# Patient Record
Sex: Male | Born: 1950 | Race: White | Hispanic: No | Marital: Single | Smoking: Former smoker
Health system: Southern US, Community
[De-identification: ages and names within clinical notes are randomized; demographics above are authoritative.]

## PROBLEM LIST (undated history)

## (undated) DIAGNOSIS — Z789 Other specified health status: Secondary | ICD-10-CM

## (undated) HISTORY — PX: SPLENECTOMY: SUR1306

---

## 2015-11-18 ENCOUNTER — Encounter: Payer: Self-pay | Admitting: Internal Medicine

## 2015-11-18 ENCOUNTER — Observation Stay
Admission: EM | Admit: 2015-11-18 | Discharge: 2015-11-19 | Disposition: A | Discharge status: Home / Self Care | Payer: Self-pay | Attending: Internal Medicine | Admitting: Internal Medicine

## 2015-11-18 ENCOUNTER — Emergency Department: Payer: Self-pay

## 2015-11-18 ENCOUNTER — Observation Stay: Payer: Self-pay

## 2015-11-18 DIAGNOSIS — I10 Essential (primary) hypertension: Secondary | ICD-10-CM | POA: Insufficient documentation

## 2015-11-18 DIAGNOSIS — D72829 Elevated white blood cell count, unspecified: Secondary | ICD-10-CM | POA: Insufficient documentation

## 2015-11-18 DIAGNOSIS — I160 Hypertensive urgency: Secondary | ICD-10-CM

## 2015-11-18 DIAGNOSIS — R200 Anesthesia of skin: Secondary | ICD-10-CM | POA: Diagnosis present

## 2015-11-18 DIAGNOSIS — Z579 Occupational exposure to unspecified risk factor: Secondary | ICD-10-CM

## 2015-11-18 DIAGNOSIS — M5134 Other intervertebral disc degeneration, thoracic region: Secondary | ICD-10-CM | POA: Insufficient documentation

## 2015-11-18 DIAGNOSIS — G451 Carotid artery syndrome (hemispheric): Secondary | ICD-10-CM

## 2015-11-18 DIAGNOSIS — I6522 Occlusion and stenosis of left carotid artery: Principal | ICD-10-CM | POA: Insufficient documentation

## 2015-11-18 DIAGNOSIS — D696 Thrombocytopenia, unspecified: Secondary | ICD-10-CM | POA: Insufficient documentation

## 2015-11-18 DIAGNOSIS — Z87891 Personal history of nicotine dependence: Secondary | ICD-10-CM | POA: Insufficient documentation

## 2015-11-18 DIAGNOSIS — E876 Hypokalemia: Secondary | ICD-10-CM | POA: Insufficient documentation

## 2015-11-18 HISTORY — DX: Other specified health status: Z78.9

## 2015-11-18 LAB — DIFFERENTIAL
Basophils Absolute: 0.1 10*3/uL (ref 0–0.1)
Basophils Relative: 1 %
Eosinophils Absolute: 0.1 10*3/uL (ref 0–0.7)
Lymphs Abs: 3.8 10*3/uL — ABNORMAL HIGH (ref 1.0–3.6)
MONO ABS: 1.5 10*3/uL — AB (ref 0.2–1.0)
Neutro Abs: 7.1 10*3/uL — ABNORMAL HIGH (ref 1.4–6.5)
Neutrophils Relative %: 56 %

## 2015-11-18 LAB — PROTIME-INR
INR: 0.84
PROTHROMBIN TIME: 11.5 s (ref 11.4–15.2)

## 2015-11-18 LAB — COMPREHENSIVE METABOLIC PANEL
ALK PHOS: 51 U/L (ref 38–126)
ALT: 19 U/L (ref 17–63)
ANION GAP: 8 (ref 5–15)
AST: 19 U/L (ref 15–41)
Albumin: 4 g/dL (ref 3.5–5.0)
BILIRUBIN TOTAL: 0.9 mg/dL (ref 0.3–1.2)
BUN: 14 mg/dL (ref 6–20)
CALCIUM: 9 mg/dL (ref 8.9–10.3)
CO2: 26 mmol/L (ref 22–32)
Chloride: 105 mmol/L (ref 101–111)
Creatinine, Ser: 0.73 mg/dL (ref 0.61–1.24)
Glucose, Bld: 108 mg/dL — ABNORMAL HIGH (ref 65–99)
Potassium: 3.4 mmol/L — ABNORMAL LOW (ref 3.5–5.1)
SODIUM: 139 mmol/L (ref 135–145)
TOTAL PROTEIN: 7.9 g/dL (ref 6.5–8.1)

## 2015-11-18 LAB — CBC
HCT: 46.2 % (ref 40.0–52.0)
Hemoglobin: 15.9 g/dL (ref 13.0–18.0)
MCH: 32.5 pg (ref 26.0–34.0)
MCHC: 34.4 g/dL (ref 32.0–36.0)
MCV: 94.5 fL (ref 80.0–100.0)
PLATELETS: 230 10*3/uL (ref 150–440)
RBC: 4.89 MIL/uL (ref 4.40–5.90)
RDW: 13.7 % (ref 11.5–14.5)
WBC: 12.6 10*3/uL — ABNORMAL HIGH (ref 3.8–10.6)

## 2015-11-18 LAB — TROPONIN I

## 2015-11-18 LAB — APTT: aPTT: 28 seconds (ref 24–36)

## 2015-11-18 LAB — GLUCOSE, CAPILLARY: GLUCOSE-CAPILLARY: 98 mg/dL (ref 65–99)

## 2015-11-18 MED ORDER — ACETAMINOPHEN 650 MG RE SUPP: 650.0000 mg | Freq: Four times a day (QID) | RECTAL | Status: DC | PRN

## 2015-11-18 MED ORDER — SODIUM CHLORIDE 0.9% FLUSH
3.0000 mL | Freq: Two times a day (BID) | INTRAVENOUS | Status: DC
  Administered 2015-11-18: 3 mL via INTRAVENOUS

## 2015-11-18 MED ORDER — LABETALOL HCL 5 MG/ML IV SOLN
10.0000 mg | INTRAVENOUS | Status: DC | PRN
  Administered 2015-11-18: 10 mg via INTRAVENOUS
  Filled 2015-11-18 (×2): qty 4

## 2015-11-18 MED ORDER — LABETALOL HCL 5 MG/ML IV SOLN
10.0000 mg | Freq: Once | INTRAVENOUS | Status: AC
  Administered 2015-11-18: 10 mg via INTRAVENOUS
  Filled 2015-11-18: qty 4

## 2015-11-18 MED ORDER — HYDRALAZINE HCL 20 MG/ML IJ SOLN: 10.0000 mg | INTRAMUSCULAR | Status: DC | PRN

## 2015-11-18 MED ORDER — ASPIRIN EC 81 MG PO TBEC
81.0000 mg | DELAYED_RELEASE_TABLET | Freq: Every day | ORAL | Status: DC
  Administered 2015-11-19: 81 mg via ORAL
  Filled 2015-11-18: qty 1

## 2015-11-18 MED ORDER — LABETALOL HCL 100 MG PO TABS
100.0000 mg | ORAL_TABLET | ORAL | Status: AC
  Administered 2015-11-18: 100 mg via ORAL
  Filled 2015-11-18: qty 1

## 2015-11-18 MED ORDER — LABETALOL HCL 5 MG/ML IV SOLN: 10.0000 mg | INTRAVENOUS | Status: DC | PRN

## 2015-11-18 MED ORDER — ENOXAPARIN SODIUM 40 MG/0.4ML ~~LOC~~ SOLN
40.0000 mg | SUBCUTANEOUS | Status: DC
  Administered 2015-11-18: 23:00:00 40 mg via SUBCUTANEOUS
  Filled 2015-11-18: qty 0.4

## 2015-11-18 MED ORDER — CARBAMIDE PEROXIDE 6.5 % OT SOLN
5.0000 [drp] | Freq: Two times a day (BID) | OTIC | Status: DC
  Administered 2015-11-18 – 2015-11-19 (×2): 5 [drp] via OTIC
  Filled 2015-11-18: qty 15

## 2015-11-18 MED ORDER — POTASSIUM CHLORIDE CRYS ER 20 MEQ PO TBCR
40.0000 meq | EXTENDED_RELEASE_TABLET | Freq: Once | ORAL | Status: AC
  Administered 2015-11-18: 40 meq via ORAL
  Filled 2015-11-18: qty 2

## 2015-11-18 MED ORDER — ACETAMINOPHEN 325 MG PO TABS: 650.0000 mg | ORAL_TABLET | Freq: Four times a day (QID) | ORAL | Status: DC | PRN

## 2015-11-18 MED ORDER — HYDROCHLOROTHIAZIDE 25 MG PO TABS
25.0000 mg | ORAL_TABLET | Freq: Every day | ORAL | Status: DC
  Administered 2015-11-18 – 2015-11-19 (×2): 25 mg via ORAL
  Filled 2015-11-18 (×2): qty 1

## 2015-11-18 MED ORDER — STROKE: EARLY STAGES OF RECOVERY BOOK
Freq: Once | Status: AC
  Administered 2015-11-18: 22:00:00

## 2015-11-18 NOTE — ED Notes (Signed)
Delivered pt blood spec to lab, handed off to lab tech.

## 2015-11-18 NOTE — ED Provider Notes (Signed)
[facility] Emergency Department Provider Note   ____________________________________________   First MD Initiated Contact with Patient 11/18/15 1857     (approximate)  I have reviewed the triage vital signs and the nursing notes.  EM caveat acute stroke, acuity of symptoms and concern for acute neurologic deficit prompted immediate evaluation for neurologic complaint and extremely focused assessment  HISTORY  Chief Complaint Numbness    HPI Raymond Ramirez is a 65 y.o. male reports no major medical history but has been told his blood pressure seemed high by his doctor in the past but didn't start treatment.  At 3 PM today the patient was getting ready for bed when he noticed it feeling of a tingling and a slight numbing over his left hand and leg. No weakness. No trouble speaking. No headache. No chest pain or shortness of breath.  He denies previous history of stroke.  No nausea or vomiting. He reports that he is told he has high blood pressure but has not been started on a medicine.   No past medical history on file.  Patient Active Problem List   Diagnosis Date Noted  . Numbness on left side 11/18/2015    No past surgical history on file.  Prior to Admission medications   Not on File  Patient took 4 full strength aspirin tablets prior to arrival today  Allergies Review of patient's allergies indicates not on file.  No family history on file.  Social History Social History  Substance Use Topics  . Smoking status: Not on file  . Smokeless tobacco: Not on file  . Alcohol use Not on file    Review of Systems Constitutional: No fever/chills Eyes: No visual changes. ENT: No sore throat. Cardiovascular: Denies chest pain. Respiratory: Denies shortness of breath. Gastrointestinal: No abdominal pain.  No nausea, no vomiting.  No diarrhea.  No constipation. Genitourinary: Negative for dysuria. Musculoskeletal: Negative for back  pain. Skin: Negative for rash. Neurological: Negative for headaches or focal weakness.  10-point ROS otherwise negative.  ____________________________________________   PHYSICAL EXAM:  VITAL SIGNS: ED Triage Vitals [11/18/15 1838]  Enc Vitals Group     BP (!) 192/99     Pulse Rate 82     Resp 20     Temp 98.1 F (36.7 C)     Temp Source Oral     SpO2 98 %     Weight 185 lb (83.9 kg)     Height 5\' 11"  (1.803 m)     Head Circumference      Peak Flow      Pain Score      Pain Loc      Pain Edu?      Excl. in GC?     Constitutional: Alert and oriented. Well appearing and in no acute distress. Eyes: Conjunctivae are normal. PERRL. EOMI. Head: Atraumatic. Nose: No congestion/rhinnorhea. Mouth/Throat: Mucous membranes are moist.  Oropharynx non-erythematous. Neck: No stridor.   Cardiovascular: Normal rate, regular rhythm. Grossly normal heart sounds.  Good peripheral circulation. Respiratory: Normal respiratory effort.  No retractions. Lungs CTAB. Gastrointestinal: Soft and nontender. No distention.  Musculoskeletal: No lower extremity tenderness nor edema.  No joint effusions. Neurologic:  Normal speech and language.   NIH score equals 1, performed by me at bedside. The patient has no pronator drift. The patient has normal cranial nerve exam. Extraocular movements are normal. Visual fields are normal. Patient has 5 out of 5 strength in all extremities. There is no  numbness or gross, acute sensory abnormality in the extremities bilaterally except for some mild decreased sensation noted over the left arm and leg. No speech disturbance. No dysarthria. No aphasia. No ataxia. Normal finger nose finger bilat. Patient speaking in full and clear sentences.   Skin:  Skin is warm, dry and intact. No rash noted. Psychiatric: Mood and affect are normal. Speech and behavior are normal.  ____________________________________________   LABS (all labs ordered are listed, but  only abnormal results are displayed)  Labs Reviewed  CBC - Abnormal; Notable for the following:       Result Value   WBC 12.6 (*)    All other components within normal limits  DIFFERENTIAL - Abnormal; Notable for the following:    Neutro Abs 7.1 (*)    Lymphs Abs 3.8 (*)    Monocytes Absolute 1.5 (*)    All other components within normal limits  COMPREHENSIVE METABOLIC PANEL - Abnormal; Notable for the following:    Potassium 3.4 (*)    Glucose, Bld 108 (*)    All other components within normal limits  PROTIME-INR  APTT  TROPONIN I  GLUCOSE, CAPILLARY  HEMOGLOBIN A1C  LIPID PANEL  BASIC METABOLIC PANEL  CBC  CBC  CREATININE, SERUM  CBG MONITORING, ED   ____________________________________________  EKG  ED ECG REPORT I, QUALE, MARK, the attending physician, personally viewed and interpreted this ECG.  Date: 11/18/2015 EKG Time: 1905 Rate: 75 Rhythm: normal sinus rhythm QRS Axis: normal Intervals: normal ST/T Wave abnormalities: Appear fairly normal, though there is a slight hint of ST depression or abnormality in lateral precordial leads Conduction Disturbances: none Narrative Interpretation: unremarkable except slight hint of ST depression or abnormality in lateral precordial leads   ____________________________________________  RADIOLOGY  FINDINGS: Brain: No evidence of acute infarction, hemorrhage, extra-axial collection, ventriculomegaly, or mass effect.  Vascular: No hyperdense vessel or unexpected calcification.  Skull: Negative for fracture or focal lesion.  Sinuses/Orbits: No acute findings.  Other: None.  IMPRESSION: Negative head CT.  No intracranial mass, hemorrhage or edema.  These results were called by telephone at the time of interpretation on 11/18/2015 at 7:04 pm to Dr. Fanny Bien , who verbally acknowledged these results. ____________________________________________   PROCEDURES  Procedure(s) performed:  None  Procedures  Critical Care performed: Yes, see critical care note(s)  CRITICAL CARE Performed by: Sharyn Creamer   Total critical care time: 40 minutes  Critical care time was exclusive of separately billable procedures and treating other patients.  Critical care was necessary to treat or prevent imminent or life-threatening deterioration.  Critical care was time spent personally by me on the following activities: development of treatment plan with patient and/or surrogate as well as nursing, discussions with consultants, evaluation of patient's response to treatment, examination of patient, obtaining history from patient or surrogate, ordering and performing treatments and interventions, ordering and review of laboratory studies, ordering and review of radiographic studies, pulse oximetry and re-evaluation of patient's condition.  Patient presents with acute neurologic deficit including paresthesia. Required emergent consultation for possible neurologic deficit and stroke activation ____________________________________________   INITIAL IMPRESSION / ASSESSMENT AND PLAN / ED COURSE  Pertinent labs & imaging results that were available during my care of the patient were reviewed by me and considered in my medical decision making (see chart for details).  Discussed with neurologist, no evidence to support TPA at this time given very minimal symptoms with myself scoring a 1 and neurology scoring a 0 on NIH at this time.  We are concerned about the possibility of hypertensive emergency as well, and we will dose labetalol for significant hypertension with neurologic symptoms.  Patient denying any other symptoms. Already took aspirin. No evidence of major infarction on CT or by clinical exam.  Clinical Course   ----------------------------------------- 7:32 PM on 11/18/2015 -----------------------------------------  Patient has showed mild improvement in his blood pressure.  Neurologist suggest lowering to 170-180, at this point I will give him labetalol by mouth. He reports at this time that the tingling and numbness in his leg has gone away, and he feels just the slightest sensation of tingling in his hand but is overall getting better.  ____________________________________________   FINAL CLINICAL IMPRESSION(S) / ED DIAGNOSES  Final diagnoses:  Hemispheric carotid artery syndrome  Hypertensive urgency      NEW MEDICATIONS STARTED DURING THIS VISIT:  New Prescriptions   No medications on file     Note:  This document was prepared using Dragon voice recognition software and may include unintentional dictation errors.     Sharyn Creamer, MD 11/18/15 2025

## 2015-11-18 NOTE — ED Triage Notes (Signed)
Reports numbness in left arm onset 3pm

## 2015-11-18 NOTE — ED Notes (Signed)
Called code stroke to 333 at 90 and [facility] 1850

## 2015-11-18 NOTE — H&P (Signed)
[facility] [place] [facility]   PATIENT NAME: Raymond Ramirez    MR#:  [id number]  DATE OF BIRTH:  1950-12-31  DATE OF ADMISSION:  11/18/2015  PRIMARY CARE PHYSICIAN: No PCP Per Patient   REQUESTING/REFERRING PHYSICIAN: Dr Sharyn Creamer  CHIEF COMPLAINT:   Chief Complaint  Patient presents with  . Numbness    HISTORY OF PRESENT ILLNESS:  Raymond Ramirez  is a 65 y.o. male presents with numbness of the left arm. He works night shifts and was trying to sleep and woke up and felt numb on his left arm. He thinks he was sleeping on his right side not his left side at the time. He took 3 aspirin. He is having a hard time to tell if he does have the same symptoms in the left leg or not. His strength is fine and coordination is fine. In the ER, his blood pressure was very elevated and hospitalist services were contacted for left arm numbness and accelerated hypertension.  PAST MEDICAL HISTORY:   Past Medical History:  Diagnosis Date  . Medical history non-contributory     PAST SURGICAL HISTORY:   Past Surgical History:  Procedure Laterality Date  . SPLENECTOMY      SOCIAL HISTORY:   Social History  Substance Use Topics  . Smoking status: Former Games developer  . Smokeless tobacco: Never Used  . Alcohol use No    FAMILY HISTORY:   Family History  Problem Relation Age of Onset  . Biliary Cirrhosis Mother   . Mesothelioma Father     DRUG ALLERGIES:  Allergies not on file  REVIEW OF SYSTEMS:  CONSTITUTIONAL: No fever, fatigue or weakness. Positive for weight gain. EYES: No blurred or double vision. Visual problems over the years. EARS, NOSE, AND THROAT: No tinnitus or ear pain. No sore throat. Runny nose at work. RESPIRATORY: No cough, shortness of breath, wheezing or hemoptysis.  CARDIOVASCULAR: No chest pain, orthopnea, edema.  GASTROINTESTINAL: No nausea, vomiting, diarrhea or abdominal pain. No blood in bowel movements GENITOURINARY: No  dysuria, hematuria.  ENDOCRINE: No polyuria, nocturia,  HEMATOLOGY: No anemia, easy bruising or bleeding SKIN: No rash or lesion. MUSCULOSKELETAL: No joint pain or arthritis.   NEUROLOGIC: Left arm numbness. PSYCHIATRY: No anxiety or depression.   MEDICATIONS AT HOME:   Prior to Admission medications   Not on File      VITAL SIGNS:  Blood pressure (!) 179/97, pulse 71, temperature 98.1 F (36.7 C), resp. rate (!) 22, height 5\' 11"  (1.803 m), weight 83.9 kg (185 lb), SpO2 98 %.  PHYSICAL EXAMINATION:  GENERAL:  65 y.o.-year-old patient lying in the bed with no acute distress.  EYES: Pupils equal, round, reactive to light and accommodation. No scleral icterus. Extraocular muscles intact.  HEENT: Head atraumatic, normocephalic. Oropharynx and nasopharynx clear.  NECK:  Supple, no jugular venous distention. No thyroid enlargement, no tenderness.  LUNGS: Normal breath sounds bilaterally, no wheezing, rales,rhonchi or crepitation. No use of accessory muscles of respiration.  CARDIOVASCULAR: S1, S2 normal. No murmurs, rubs, or gallops.  ABDOMEN: Soft, nontender, nondistended. Bowel sounds present. No organomegaly or mass.  EXTREMITIES: No pedal edema, cyanosis, or clubbing.  NEUROLOGIC: Cranial nerves II through XII are intact. Muscle strength 5/5 in all extremities. Sensation slightly decreased left arm. Gait not checked.  PSYCHIATRIC: The patient is alert and oriented x 3.  SKIN: No rash, lesion, or ulcer.   LABORATORY PANEL:   CBC  Recent Labs Lab 11/18/15 1900  WBC 12.6*  HGB 15.9  HCT 46.2  PLT 230   ------------------------------------------------------------------------------------------------------------------  Chemistries   Recent Labs Lab 11/18/15 1900  NA 139  K 3.4*  CL 105  CO2 26  GLUCOSE 108*  BUN 14  CREATININE 0.73  CALCIUM 9.0  AST 19  ALT 19  ALKPHOS 51  BILITOT 0.9    ------------------------------------------------------------------------------------------------------------------  Cardiac Enzymes  Recent Labs Lab 11/18/15 1900  TROPONINI <0.03   ------------------------------------------------------------------------------------------------------------------  RADIOLOGY:  Ct Head Wo Contrast  Result Date: 11/18/2015 CLINICAL DATA:  Code stroke, left arm numbness onset 3 p.m. EXAM: CT HEAD WITHOUT CONTRAST TECHNIQUE: Contiguous axial images were obtained from the base of the skull through the vertex without intravenous contrast. COMPARISON:  None. FINDINGS: Brain: No evidence of acute infarction, hemorrhage, extra-axial collection, ventriculomegaly, or mass effect. Vascular: No hyperdense vessel or unexpected calcification. Skull: Negative for fracture or focal lesion. Sinuses/Orbits: No acute findings. Other: None. IMPRESSION: Negative head CT.  No intracranial mass, hemorrhage or edema. These results were called by telephone at the time of interpretation on 11/18/2015 at 7:04 pm to Dr. Fanny Bien , who verbally acknowledged these results. Electronically Signed   By: Bary Meeghan Skipper M.D.   On: 11/18/2015 19:05    EKG:   Normal sinus rhythm 63 bpm  IMPRESSION AND PLAN:   1. Left arm numbness. Will admit as observation. Rule out stroke with MRI brain, carotid ultrasound and echocardiogram. Start aspirin. Check lipid profile in the a.m. This could be secondary to accelerated hypertension. 2. Accelerated hypertension. ER physician gave a dose of labetalol. I will give hydrochlorothiazide 25 mg daily and continue to monitor. 3. Relative thrombocytopenia check a hepatitis C 4. Leukocytosis unclear cause check a chest x-ray 5. Hypokalemia give 1 dose of potassium  All the records are reviewed and case discussed with ED provider. Management plans discussed with the patient, and he is in agreement.  CODE STATUS: Full code  TOTAL TIME TAKING CARE OF THIS  PATIENT: 50 minutes.    Alford Highland M.D on 11/18/2015 at 8:28 PM  Between 7am to 6pm - Pager - [phone]  After 6pm call admission pager [phone]  [facility]  [phone]  CC: Primary care physician; No PCP Per Patient

## 2015-11-19 ENCOUNTER — Observation Stay: Payer: Self-pay

## 2015-11-19 ENCOUNTER — Observation Stay (HOSPITAL_BASED_OUTPATIENT_CLINIC_OR_DEPARTMENT_OTHER)
Admit: 2015-11-19 | Discharge: 2015-11-19 | Disposition: A | Discharge status: Home / Self Care | Payer: Self-pay | Attending: Internal Medicine | Admitting: Internal Medicine

## 2015-11-19 DIAGNOSIS — G459 Transient cerebral ischemic attack, unspecified: Secondary | ICD-10-CM

## 2015-11-19 LAB — BASIC METABOLIC PANEL
Anion gap: 8 (ref 5–15)
BUN: 13 mg/dL (ref 6–20)
CHLORIDE: 106 mmol/L (ref 101–111)
CO2: 26 mmol/L (ref 22–32)
CREATININE: 0.67 mg/dL (ref 0.61–1.24)
Calcium: 8.9 mg/dL (ref 8.9–10.3)
GFR calc non Af Amer: >60 mL/min (ref >60)
Glucose, Bld: 95 mg/dL (ref 65–99)
Potassium: 3.6 mmol/L (ref 3.5–5.1)
Sodium: 140 mmol/L (ref 135–145)

## 2015-11-19 LAB — LIPID PANEL
CHOLESTEROL: 144 mg/dL (ref 0–200)
HDL: 55 mg/dL (ref >40)
LDL Cholesterol: 68 mg/dL (ref 0–99)
TRIGLYCERIDES: 104 mg/dL (ref <150)
Total CHOL/HDL Ratio: 2.6 RATIO
VLDL: 21 mg/dL (ref 0–40)

## 2015-11-19 LAB — CBC
HCT: 42.2 % (ref 40.0–52.0)
Hemoglobin: 14.6 g/dL (ref 13.0–18.0)
MCH: 32.6 pg (ref 26.0–34.0)
MCHC: 34.7 g/dL (ref 32.0–36.0)
MCV: 94.1 fL (ref 80.0–100.0)
PLATELETS: 219 10*3/uL (ref 150–440)
RBC: 4.49 MIL/uL (ref 4.40–5.90)
RDW: 13.6 % (ref 11.5–14.5)
WBC: 10.5 10*3/uL (ref 3.8–10.6)

## 2015-11-19 LAB — ECHOCARDIOGRAM COMPLETE
Height: 71 in
Weight: 2990.4 oz

## 2015-11-19 LAB — HEMOGLOBIN A1C: HEMOGLOBIN A1C: 5.7 % (ref 4.0–6.0)

## 2015-11-19 MED ORDER — ASPIRIN 81 MG PO TBEC: 81.0000 mg | DELAYED_RELEASE_TABLET | Freq: Every day | ORAL | Status: AC

## 2015-11-19 MED ORDER — HYDROCHLOROTHIAZIDE 25 MG PO TABS: 25.0000 mg | ORAL_TABLET | Freq: Every day | ORAL | 0 refills | Status: AC

## 2015-11-19 NOTE — Discharge Instructions (Signed)
°Stress and Stress Management °Stress is a normal reaction to life events. It is what you feel when life demands more than you are used to or more than you can handle. Some stress can be useful. For example, the stress reaction can help you catch the last bus of the day, study for a test, or meet a deadline at work. But stress that occurs too often or for too long can cause problems. It can affect your emotional health and interfere with relationships and normal daily activities. Too much stress can weaken your immune system and increase your risk for physical illness. If you already have a medical problem, stress can make it worse. °CAUSES  °All sorts of life events may cause stress. An event that causes stress for one person may not be stressful for another person. Major life events commonly cause stress. These may be positive or negative. Examples include losing your job, moving into a new home, getting married, having a baby, or losing a loved one. Less obvious life events may also cause stress, especially if they occur day after day or in combination. Examples include working long hours, driving in traffic, caring for children, being in debt, or being in a difficult relationship. °SIGNS AND SYMPTOMS °Stress may cause emotional symptoms including, the following: °· Anxiety. This is feeling worried, afraid, on edge, overwhelmed, or out of control. °· Anger. This is feeling irritated or impatient. °· Depression. This is feeling sad, down, helpless, or guilty. °· Difficulty focusing, remembering, or making decisions. °Stress may cause physical symptoms, including the following:  °· Aches and pains. These may affect your head, neck, back, stomach, or other areas of your body. °· Tight muscles or clenched jaw. °· Low energy or trouble sleeping.  °Stress may cause unhealthy behaviors, including the following:  °· Eating to feel better (overeating) or skipping meals. °· Sleeping too little, too much, or  both. °· Working too much or putting off tasks (procrastination). °· Smoking, drinking alcohol, or using drugs to feel better. °DIAGNOSIS  °Stress is diagnosed through an assessment by your health care provider. Your health care provider will ask questions about your symptoms and any stressful life events. Your health care provider will also ask about your medical history and may order blood tests or other tests. Certain medical conditions and medicine can cause physical symptoms similar to stress.  Mental illness can cause emotional symptoms and unhealthy behaviors similar to stress. Your health care provider may refer you to a mental health professional for further evaluation.  °TREATMENT  °Stress management is the recommended treatment for stress. The goals of stress management are reducing stressful life events and coping with stress in healthy ways.  °Techniques for reducing stressful life events include the following: °· Stress identification. Self-monitor for stress and identify what causes stress for you. These skills may help you to avoid some stressful events. °· Time management. Set your priorities, keep a calendar of events, and learn to say "no." These tools can help you avoid making too many commitments. °Techniques for coping with stress include the following: °· Rethinking the problem. Try to think realistically about stressful events rather than ignoring them or overreacting. Try to find the positives in a stressful situation rather than focusing on the negatives. °· Exercise. Physical exercise can release both physical and emotional tension. The key is to find a form of exercise you enjoy and do it regularly. °· Relaxation techniques. These relax the body and mind. Examples include yoga, meditation, tai chi, biofeedback, deep   deep breathing, progressive muscle relaxation, listening to music, being out in nature, journaling, and other hobbies. Again, the key is to find one or more that you enjoy and can  do regularly.  Healthy lifestyle. Eat a balanced diet, get plenty of sleep, and do not smoke. Avoid using alcohol or drugs to relax.  Strong support network. Spend time with family, friends, or other people you enjoy being around.Express your feelings and talk things over with someone you trust. Counseling or talktherapy with a mental health professional may be helpful if you are having difficulty managing stress on your own. Medicine is typically not recommended for the treatment of stress.Talk to your health care provider if you think you need medicine for symptoms of stress. HOME CARE INSTRUCTIONS  Keep all follow-up visits as directed by your health care provider.  Take all medicines as directed by your health care provider. SEEK MEDICAL CARE IF:  Your symptoms get worse or you start having new symptoms.  You feel overwhelmed by your problems and can no longer manage them on your own. SEEK IMMEDIATE MEDICAL CARE IF:  You feel like hurting yourself or someone else.   This information is not intended to replace advice given to you by your health care provider. Make sure you discuss any questions you have with your health care provider.   Document Released: 09/29/2000 Document Revised: 04/26/2014 Document Reviewed: 11/28/2012 Elsevier Interactive Patient Education 2016 [employer]   Transient Ischemic Attack A transient ischemic attack (TIA) is a "warning stroke" that causes stroke-like symptoms. Unlike a stroke, a TIA does not cause permanent damage to the brain. The symptoms of a TIA can happen very fast and do not last long. It is important to know the symptoms of a TIA and what to do. This can help prevent a major stroke or death. CAUSES  A TIA is caused by a temporary blockage in an artery in the brain or neck (carotid artery). The blockage does not allow the brain to get the blood supply it needs and can cause different symptoms. The blockage can be caused by either:  A  blood clot.  Fatty buildup (plaque) in a neck or brain artery. RISK FACTORS  High blood pressure (hypertension).  High cholesterol.  Diabetes mellitus.  Heart disease.  The buildup of plaque in the blood vessels (peripheral artery disease or atherosclerosis).  The buildup of plaque in the blood vessels that provide blood and oxygen to the brain (carotid artery stenosis).  An abnormal heart rhythm (atrial fibrillation).  Obesity.  Using any tobacco products, including cigarettes, chewing tobacco, or electronic cigarettes.  Taking oral contraceptives, especially in combination with using tobacco.  Physical inactivity.  A diet high in fats, salt (sodium), and calories.  Excessive alcohol use.  Use of illegal drugs (especially cocaine and methamphetamine).  Being male.  Being African American.  Being over the age of 74 years.  Family history of stroke.  Previous history of blood clots, stroke, TIA, or heart attack.  Sickle cell disease. SIGNS AND SYMPTOMS  TIA symptoms are the same as a stroke but are temporary. These symptoms usually develop suddenly, or may be newly present upon waking from sleep:  Sudden weakness or numbness of the face, arm, or leg, especially on one side of the body.  Sudden trouble walking or difficulty moving arms or legs.  Sudden confusion.  Sudden personality changes.  Trouble speaking (aphasia) or understanding.  Difficulty swallowing.  Sudden trouble seeing in one or both eyes.  Double vision. °· Dizziness. °· Loss of balance or coordination. °· Sudden severe headache with no known cause. °· Trouble reading or writing. °· Loss of bowel or bladder control. °· Loss of consciousness. °DIAGNOSIS  °Your health care provider may be able to determine the presence or absence of a TIA based on your symptoms, history, and physical exam. CT scan of the brain is usually performed to help identify a TIA. Other tests may  include: °· Electrocardiography (ECG). °· Continuous heart monitoring. °· Echocardiography. °· Carotid ultrasonography. °· MRI. °· A scan of the brain circulation. °· Blood tests. °TREATMENT  °Since the symptoms of TIA are the same as a stroke, it is important to seek treatment as soon as possible. You may need a medicine to dissolve a blood clot (thrombolytic) if that is the cause of the TIA. This medicine cannot be given if too much time has passed. Treatment may also include:  °· Rest, oxygen, fluids through an IV tube, and medicines to thin the blood (anticoagulants). °· Measures will be taken to prevent short-term and long-term complications, including infection from breathing foreign material into the lungs (aspiration pneumonia), blood clots in the legs, and falls. °· Procedures to either remove plaque in the carotid arteries or dilate carotid arteries that have narrowed due to plaque. Those procedures are: °¨ Carotid endarterectomy. °¨ Carotid angioplasty and stenting. °· Medicines and diet may be used to address diabetes, high blood pressure, and other underlying risk factors. °HOME CARE INSTRUCTIONS  °· Take medicines only as directed by your health care provider. Follow the directions carefully. Medicines may be used to control risk factors for a stroke. Be sure you understand all your medicine instructions. °· You may be told to take aspirin or the anticoagulant warfarin. Warfarin needs to be taken exactly as instructed. °¨ Taking too much or too little warfarin is dangerous. Too much warfarin increases the risk of bleeding. Too little warfarin continues to allow the risk for blood clots. While taking warfarin, you will need to have regular blood tests to measure your blood clotting time. A PT blood test measures how long it takes for blood to clot. Your PT is used to calculate another value called an INR. Your PT and INR help your health care provider to adjust your dose of warfarin. The dose can change  for many reasons. It is critically important that you take warfarin exactly as prescribed. °¨ Many foods, especially foods high in vitamin K can interfere with warfarin and affect the PT and INR. Foods high in vitamin K include spinach, kale, broccoli, cabbage, collard and turnip greens, Brussels sprouts, peas, cauliflower, seaweed, and parsley, as well as beef and pork liver, green tea, and soybean oil. You should eat a consistent amount of foods high in vitamin K. Avoid major changes in your diet, or notify your health care provider before changing your diet. Arrange a visit with a dietitian to answer your questions. °¨ Many medicines can interfere with warfarin and affect the PT and INR. You must tell your health care provider about any and all medicines you take; this includes all vitamins and supplements. Be especially cautious with aspirin and anti-inflammatory medicines. Do not take or discontinue any prescribed or over-the-counter medicine except on the advice of your health care provider or pharmacist. °¨ Warfarin can have side effects, such as excessive bruising or bleeding. You will need to hold pressure over cuts for longer than usual. Your health care provider or pharmacist will discuss other   potential side effects. °¨ Avoid sports or activities that may cause injury or bleeding. °¨ Be careful when shaving, flossing your teeth, or handling sharp objects. °¨ Alcohol can change the body's ability to handle warfarin. It is best to avoid alcoholic drinks or consume only very small amounts while taking warfarin. Notify your health care provider if you change your alcohol intake. °¨ Notify your dentist or other health care providers before procedures. °· Eat a diet that includes 5 or more servings of fruits and vegetables each day. This may reduce the risk of stroke. Certain diets may be prescribed to address high blood pressure, high cholesterol, diabetes, or obesity. °¨ A diet low in sodium, saturated fat,  trans fat, and cholesterol is recommended to manage high blood pressure. °¨ A diet low in saturated fat, trans fat, and cholesterol, and high in fiber may control cholesterol levels. °¨ A controlled-carbohydrate, controlled-sugar diet is recommended to manage diabetes. °¨ A reduced-calorie diet that is low in sodium, saturated fat, trans fat, and cholesterol is recommended to manage obesity. °· Maintain a healthy weight. °· Stay physically active. It is recommended that you get at least 30 minutes of activity on most or all days. °· Do not use any tobacco products, including cigarettes, chewing tobacco, or electronic cigarettes. If you need help quitting, ask your health care provider. °· Limit alcohol intake to no more than 1 drink per day for nonpregnant women and 2 drinks per day for men. One drink equals 12 ounces of beer, 5 ounces of wine, or 1½ ounces of hard liquor. °· Do not abuse drugs. °· A safe home environment is important to reduce the risk of falls. Your health care provider may arrange for specialists to evaluate your home. Having grab bars in the bedroom and bathroom is often important. Your health care provider may arrange for equipment to be used at home, such as raised toilets and a seat for the shower. °· Follow all instructions for follow-up with your health care provider. This is very important. This includes any referrals and lab tests. Proper follow-up can prevent a stroke or another TIA from occurring. °PREVENTION  °The risk of a TIA can be decreased by appropriately treating high blood pressure, high cholesterol, diabetes, heart disease, and obesity, and by quitting smoking, limiting alcohol, and staying physically active. °SEEK MEDICAL CARE IF: °· You have personality changes. °· You have difficulty swallowing. °· You are seeing double. °· You have dizziness. °· You have a fever. °SEEK IMMEDIATE MEDICAL CARE IF:  °Any of the following symptoms may represent a serious problem that is an  emergency. Do not wait to see if the symptoms will go away. Get medical help right away. Call your local emergency services (911 in [place] Do not drive yourself to the hospital. °· You have sudden weakness or numbness of the face, arm, or leg, especially on one side of the body. °· You have sudden trouble walking or difficulty moving arms or legs. °· You have sudden confusion. °· You have trouble speaking (aphasia) or understanding. °· You have sudden trouble seeing in one or both eyes. °· You have a loss of balance or coordination. °· You have a sudden, severe headache with no known cause. °· You have new chest pain or an irregular heartbeat. °· You have a partial or total loss of consciousness. °MAKE SURE YOU:  °· Understand these instructions. °· Will watch your condition. °· Will get help right away if you are not doing   well or get worse.   This information is not intended to replace advice given to you by your health care provider. Make sure you discuss any questions you have with your health care provider.   Document Released: 01/13/2005 Document Revised: 04/26/2014 Document Reviewed: 07/11/2013 Elsevier Interactive Patient Education 2016 [employer]   Carotid Artery Disease  The carotid arteries are arteries on both sides of the neck. They carry blood to the brain. Carotid artery disease is when the arteries get smaller (narrow) or get blocked. If these arteries get smaller or get blocked, you are more likely to have a stroke or warning stroke (transient ischemic attack).  HOME CARE  Take medicines as told by your doctor. Make sure you understand all your medicine instructions. Do not stop your medicines without talking to your doctor first.  Follow your doctor's diet instructions. It is important to eat a healthy diet that includes plenty of:  Fresh fruits.  Vegetables.  Lean meats.  Avoid:  High-fat foods.  High-sodium foods.  Foods that are fried, overly processed, or have  poor nutritional value.  Stay a healthy weight.  Stay active. Get at least 30 minutes of activity every day.  Do not smoke.  Limit alcohol use to:  No more than 2 drinks a day for men.  No more than 1 drink a day for women who are not pregnant.  Do not use illegal drugs.  Keep all doctor visits as told. GET HELP RIGHT AWAY IF:   You have sudden weakness or loss of feeling (numbness) on one side of the body, such as the face, arm, or leg.  You have sudden confusion.  You have trouble speaking (aphasia) or understanding.  You have sudden trouble seeing out of one or both eyes.  You have sudden trouble walking.  You have dizziness or feel like you might pass out (faint).  You have a loss of balance or your movements are not steady (uncoordinated).  You have a sudden, severe headache with no known cause.  You have trouble swallowing (dysphagia). Call your local emergency services (911 in [place] Do notdrive yourself to the clinic or hospital.    This information is not intended to replace advice given to you by your health care provider. Make sure you discuss any questions you have with your health care provider.   Document Released: 03/22/2012 Document Revised: 12/06/2012 Document Reviewed: 10/04/2012 Elsevier Interactive Patient Education 2016 [employer].  DIET:  Cardiac diet  DISCHARGE CONDITION:  Stable  ACTIVITY:  Activity as tolerated  OXYGEN:  Home Oxygen: No.   Oxygen Delivery: room air  DISCHARGE LOCATION:  home    ADDITIONAL DISCHARGE INSTRUCTION:   If you experience worsening of your admission symptoms, develop shortness of breath, life threatening emergency, suicidal or homicidal thoughts you must seek medical attention immediately by calling 911 or calling your MD immediately  if symptoms less severe.  You Must read complete instructions/literature along with all the possible adverse reactions/side effects for all the Medicines you take  and that have been prescribed to you. Take any new Medicines after you have completely understood and accpet all the possible adverse reactions/side effects.   Please note  You were cared for by a hospitalist during your hospital stay. If you have any questions about your discharge medications or the care you received while you were in the hospital after you are discharged, you can call the unit and asked to speak with the hospitalist on call if the  hospitalist that took care of you is not available. Once you are discharged, your primary care physician will handle any further medical issues. Please note that NO REFILLS for any discharge medications will be authorized once you are discharged, as it is imperative that you return to your primary care physician (or establish a relationship with a primary care physician if you do not have one) for your aftercare needs so that they can reassess your need for medications and monitor your lab values. ° ° °

## 2015-11-19 NOTE — Discharge Summary (Signed)
Raymond Ramirez, 65 y.o., DOB 06-Aug-1950, MRN [id number]. 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 male presents with numbness of the left arm. He works night shifts and was trying to sleep and woke up and felt numb on his left arm. He thinks he was sleeping on his right side not his left side at the time. He took 3 aspirin. He is having a hard time. Due to the symptoms he came to the ED for concern for stroke he was admitted under observation further workup included MRI of the brain which showed no evidence of a stroke. His numbness is mostly resolved on the left side. If he continues to have the symptoms consider radiculopathy as well as possible nerve compression he needs to follow up with outpatient neurology for possible EMG.             Consults  None  Significant Tests:  See full reports for all details     Dg Chest 2 View  Result Date: 11/18/2015 CLINICAL DATA:  LEFT arm numbness, accelerated hypertension, former smoker EXAM: CHEST  2 VIEW COMPARISON:  None FINDINGS: Enlargement of cardiac silhouette. Mediastinal contours and pulmonary vascularity normal. Lungs slightly hyperinflated but clear. No infiltrate, pleural effusion or pneumothorax. Mild scattered degenerative disc disease changes thoracic spine. IMPRESSION: No definite acute abnormalities. Electronically Signed   By: Ulyses Southward M.D.   On: 11/18/2015 21:08   Ct Head Wo Contrast  Result Date: 11/18/2015 CLINICAL DATA:  Code stroke, left arm numbness onset 3 p.m. EXAM: CT HEAD WITHOUT CONTRAST TECHNIQUE: Contiguous axial images  were obtained from the base of the skull through the vertex without intravenous contrast. COMPARISON:  None. FINDINGS: Brain: No evidence of acute infarction, hemorrhage, extra-axial collection, ventriculomegaly, or mass effect. Vascular: No hyperdense vessel or unexpected calcification. Skull: Negative for fracture or focal lesion. Sinuses/Orbits: No acute findings. Other: None. IMPRESSION: Negative head CT.  No intracranial mass, hemorrhage or edema. These results were called by telephone at the time of interpretation on 11/18/2015 at 7:04 pm to Dr. Fanny Bien , who verbally acknowledged these results. Electronically Signed   By: Bary Richard M.D.   On: 11/18/2015 19:05   Mr Brain Wo Contrast  Result Date: 11/19/2015 CLINICAL DATA:  Acute left arm numbness and accelerated hypertension. EXAM: MRI HEAD WITHOUT CONTRAST TECHNIQUE: Multiplanar, multiecho pulse sequences of the brain and surrounding structures were obtained without intravenous contrast. COMPARISON:  None. FINDINGS: Calvarium and upper cervical spine: No focal marrow signal abnormality. Orbits: Negative. Sinuses and Mastoids: Complete opacification of the right maxillary sinus with peripheral mucosal thickening and central desiccated mucous. The sinus is atelectatic consistent with longstanding obstruction. Brain: No acute infarct, hemorrhage, hydrocephalus, collection or mass lesion. No evidence of large vessel occlusion. Mild periventricular hazy FLAIR hyperintensity and few subcortical hyperintensities. Findings are nonspecific but usually attributed to chronic microvascular disease at this age. No specific demyelinating pattern. IMPRESSION: 1. No acute finding, including infarct. 2. Mild white matter disease, usually chronic microvascular ischemia. 3. 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 Electronically Signed   By: Marnee Spring M.D.   On: 11/19/2015 09:42  US Carotid Bilateral  Result Date: 11/19/2015 CLINICAL DATA:  Hypertension,  previous tobacco use, left-sided weakness EXAM: BILATERAL CAROTID DUPLEX ULTRASOUND TECHNIQUE: Wallace Cullens scale imaging, color Doppler and duplex ultrasound were performed of bilateral carotid and vertebral arteries in the neck. COMPARISON:  11/18/2015 head CT without contrast FINDINGS: Criteria: Quantification of carotid stenosis is based on velocity parameters that correlate the residual internal carotid diameter with NASCET-based stenosis levels, using the diameter of the distal internal carotid lumen as the denominator for stenosis measurement. The following velocity measurements were obtained: RIGHT ICA:  96/30 cm/sec CCA:  71/12 cm/sec SYSTOLIC ICA/CCA RATIO:  1.4 DIASTOLIC ICA/CCA RATIO:  2.6 ECA:  109 cm/sec LEFT ICA:  77/23 cm/sec CCA:  112/22 cm/sec SYSTOLIC ICA/CCA RATIO:  0.7 DIASTOLIC ICA/CCA RATIO:  1.1 ECA:  116 cm/sec RIGHT CAROTID ARTERY: Minor echogenic shadowing plaque formation. No hemodynamically significant right ICA stenosis, velocity elevation, or turbulent flow. Degree of narrowing less than 50%. RIGHT VERTEBRAL ARTERY:  Antegrade LEFT CAROTID ARTERY: Similar scattered minor echogenic plaque formation. No hemodynamically significant left ICA stenosis, velocity elevation, or turbulent flow. LEFT VERTEBRAL ARTERY:  Antegrade IMPRESSION: Very minor carotid atherosclerosis. No hemodynamically significant ICA stenosis. Degree of narrowing less than 50% bilaterally. Patent antegrade vertebral flow bilaterally. Electronically Signed   By: Judie Petit.  Shick M.D.   On: 11/19/2015 11:14       Today   Subjective:   Raymond Ramirez   Feels better some numbness on left  Objective:   Blood pressure (!) 156/87, pulse 68, temperature 97.9 F (36.6 C), temperature source Oral, resp. rate 20, height 5\' 11"  (1.803 m), weight 84.8 kg (186 lb 14.4 oz), SpO2 97 %.  . No intake or output data in the 24 hours ending 11/19/15 1458  Exam VITAL SIGNS: Blood pressure (!) 156/87, pulse 68, temperature 97.9 F (36.6  C), temperature source Oral, resp. rate 20, height 5\' 11"  (1.803 m), weight 84.8 kg (186 lb 14.4 oz), SpO2 97 %.  GENERAL:  65 y.o.-year-old patient lying in the bed with no acute distress.  EYES: Pupils equal, round, reactive to light and accommodation. No scleral icterus. Extraocular muscles intact.  HEENT: Head atraumatic, normocephalic. Oropharynx and nasopharynx clear.  NECK:  Supple, no jugular venous distention. No thyroid enlargement, no tenderness.  LUNGS: Normal breath sounds bilaterally, no wheezing, rales,rhonchi or crepitation. No use of accessory muscles of respiration.  CARDIOVASCULAR: S1, S2 normal. No murmurs, rubs, or gallops.  ABDOMEN: Soft, nontender, nondistended. Bowel sounds present. No organomegaly or mass.  EXTREMITIES: No pedal edema, cyanosis, or clubbing.  NEUROLOGIC: Cranial nerves II through XII are intact. Muscle strength 5/5 in all extremities. Sensation intact. Gait not checked.  PSYCHIATRIC: The patient is alert and oriented x 3.  SKIN: No obvious rash, lesion, or ulcer.   Data Review     CBC w Diff:  Lab Results  Component Value Date   WBC 10.5 11/19/2015   HGB 14.6 11/19/2015   HCT 42.2 11/19/2015   PLT 219 11/19/2015   LYMPHOPCT 30% 11/18/2015   MONOPCT 12% 11/18/2015   EOSPCT 1% 11/18/2015   BASOPCT 1% 11/18/2015   CMP:  Lab Results  Component Value Date   NA 140 11/19/2015   K 3.6 11/19/2015   CL 106 11/19/2015   CO2 26 11/19/2015   BUN 13 11/19/2015   CREATININE 0.67 11/19/2015   PROT 7.9 11/18/2015   ALBUMIN 4.0 11/18/2015   BILITOT 0.9 11/18/2015   ALKPHOS 51 11/18/2015   AST 19 11/18/2015  ALT 19 11/18/2015  .  Micro Results No results found for this or any previous visit (from the past 240 hour(s)).      Code Status Orders        Start     Ordered   11/18/15 2024  Full code  Continuous     11/18/15 2024    Code Status History    Date Active Date Inactive Code Status Order ID Comments User Context   11/18/2015   8:24 PM 11/19/2015  7:53 AM Full Code [id number]  Alford Highland, MD ED          Follow-up Information    [facility], MD Follow up in 3 week(s).   Specialty:  Neurology Why:  left arm numbness Contact information: [address] [facility] [place] [address] [phone]           Discharge Medications     Medication List    TAKE these medications   aspirin 81 MG EC tablet Take 1 tablet (81 mg total) by mouth daily.   hydrochlorothiazide 25 MG tablet Commonly known as:  HYDRODIURIL Take 1 tablet (25 mg total) by mouth daily.          Total Time in preparing paper work, data evaluation and todays exam - 35 minutes  Auburn Bilberry M.D on 11/19/2015 at 2:58 PM  [facility]   Office  [phone]

## 2015-11-19 NOTE — Progress Notes (Signed)
CSW received a consult for nursing home patient. Patient is self-pay. CSW will need a PT consult to to determine discharge needs.   Woodroe Mode, MSW, LCSW, LCAS-A Clinical Social Worker [phone]

## 2015-11-19 NOTE — Progress Notes (Signed)
Pt has had uneventful night.  Neuro checks prove to be normal.  Numbness and tingling has resolved.

## 2015-11-19 NOTE — Progress Notes (Signed)
*  PRELIMINARY RESULTS* Echocardiogram 2D Echocardiogram has been performed.  Raymond Ramirez 11/19/2015, 8:27 AM

## 2015-11-19 NOTE — Progress Notes (Signed)
Pt for discharge home. Alert. No resp distress.  All tests were neg  This am  md  Discharging pt home.  Instructions discussed with pt.  All meds discussed . Diet activity and f/u discussed. Verbalizes understanding.

## 2015-11-19 NOTE — Progress Notes (Signed)
[facility] [place] at [facility]        Raymond Ramirez was admitted to the Hospital on 11/18/2015 and Discharged  11/19/2015 and should be excused from work/school   for2  days starting 11/18/2015 , may return to work/school without any restrictions.  Call Auburn Bilberry MD with questions.  Auburn Bilberry M.D on 11/19/2015,at 11:26 AM  [facility] [place] [facility]  [phone]

## 2015-11-20 LAB — HEPATITIS C ANTIBODY: HCV Ab: <0.1 s/co ratio (ref 0.0–0.9)

## 2016-10-26 IMAGING — MR MR HEAD W/O CM
10 series · 41 of 48 positions shown · non-contrast
Comparison: None.

CLINICAL DATA: Acute left arm numbness and accelerated
hypertension.

EXAM:
MRI HEAD WITHOUT CONTRAST
TECHNIQUE: Multiplanar, multiecho pulse sequences of the brain and surrounding
structures were obtained without intravenous contrast.

[Series 2: T1 · sagittal · 5.0mm · 0.45mm/px · 4 of 30 slices shown (1 of 2)]
[im 1/30]
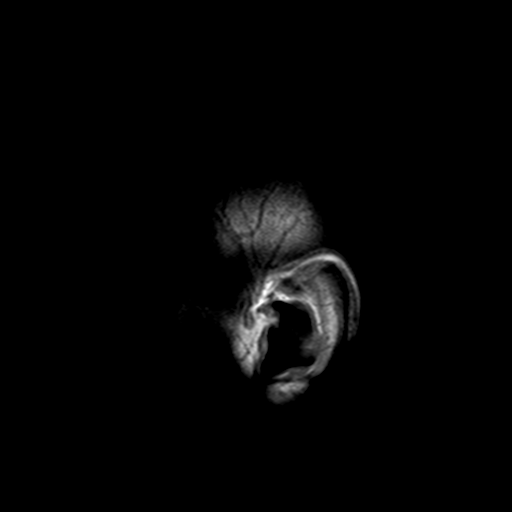
[im 10/30]
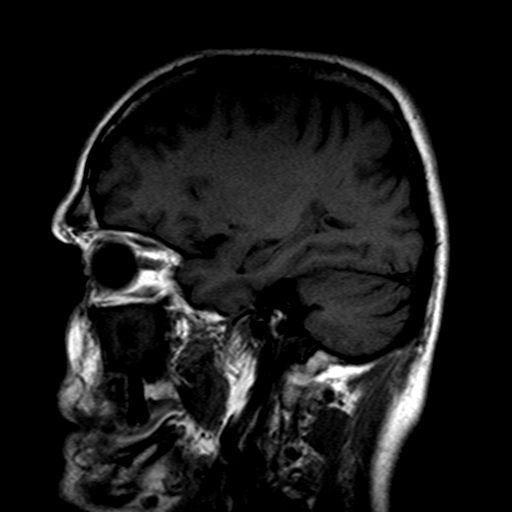
[im 20/30]
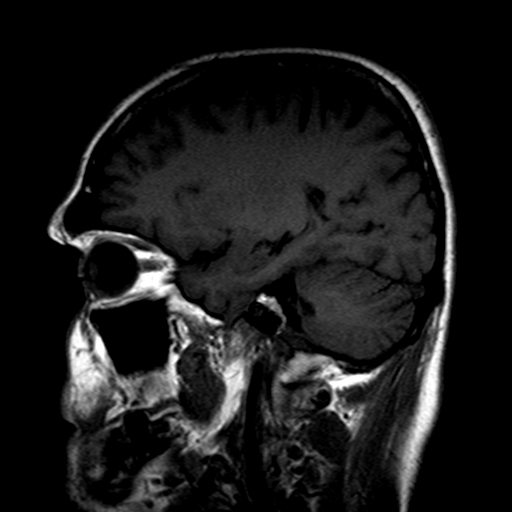
[im 30/30]
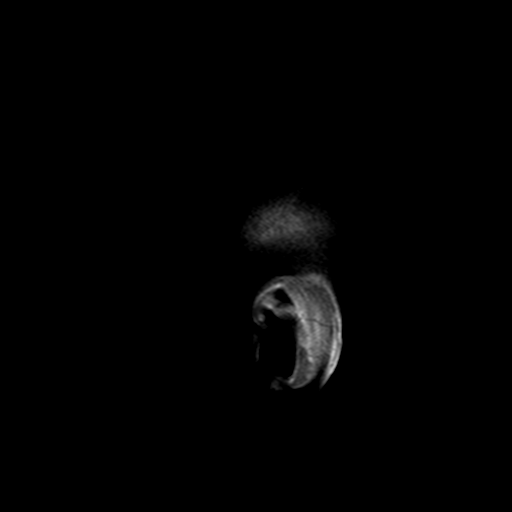

[Series 4: DWI · axial · 4.0mm · 0.94mm/px · z∈[-67,+105]mm · 6 of 44 slices shown (1 of 4)]
[im 1/44]
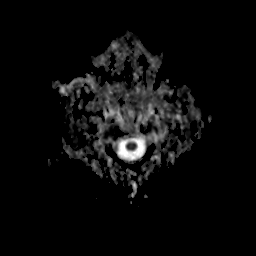
[im 9/44]
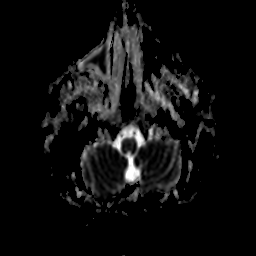
[im 18/44]
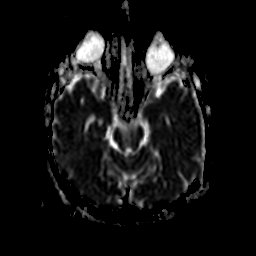
[im 26/44]
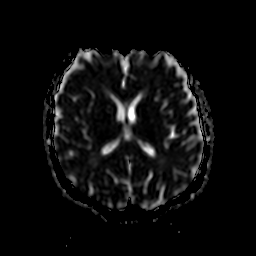
[im 35/44]
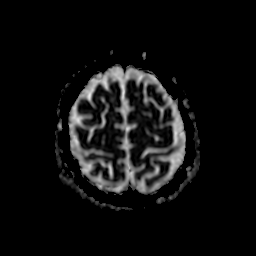
[im 44/44]
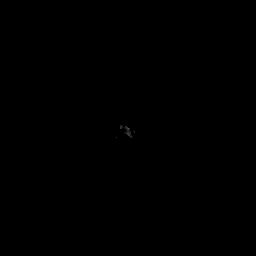

[Series 6: DWI · coronal · 5.0mm · 1.80mm/px · 5 of 38 slices shown (2 of 4)]
[im 1/38]
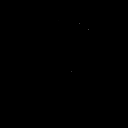
[im 10/38]
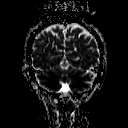
[im 19/38]
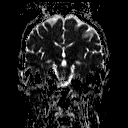
[im 28/38]
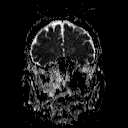
[im 38/38]
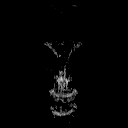

[Series 7: T2 · axial · 5.0mm · 0.45mm/px · z∈[-58,+104]mm · 3 of 26 slices shown (1 of 3)]
[im 1/26]
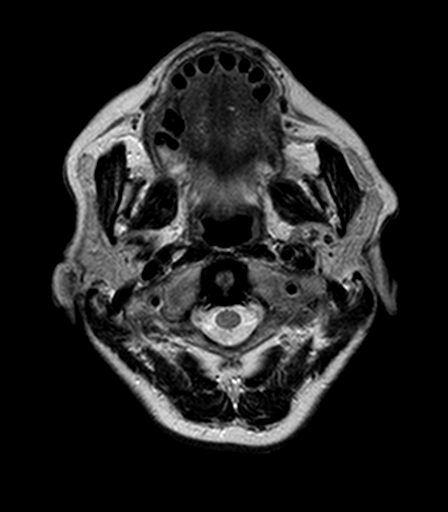
[im 13/26]
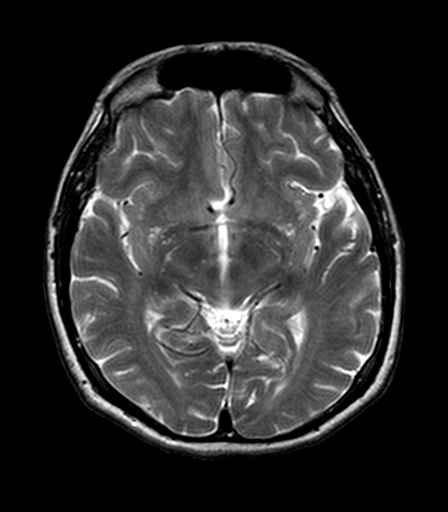
[im 26/26]
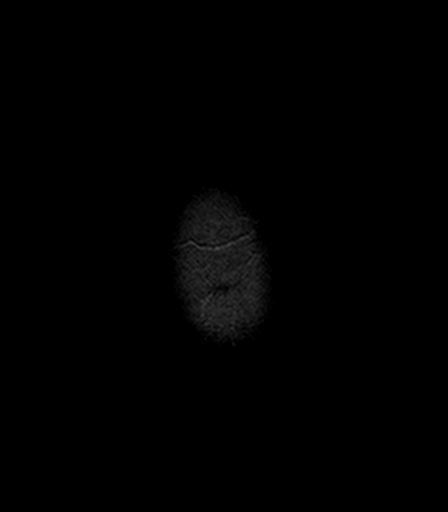

[Series 8: DWI · axial · 4.0mm · 0.94mm/px · z∈[-67,+101]mm · 6 of 42 slices shown (3 of 4)]
[im 1/42]
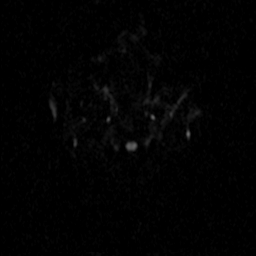
[im 9/42]
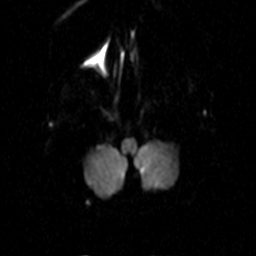
[im 17/42]
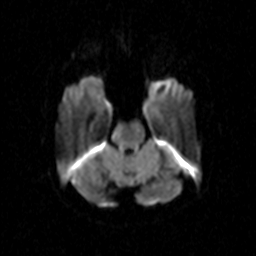
[im 25/42]
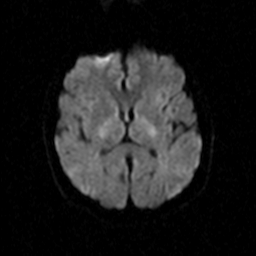
[im 33/42]
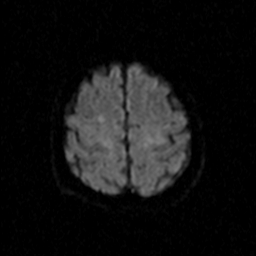
[im 42/42]
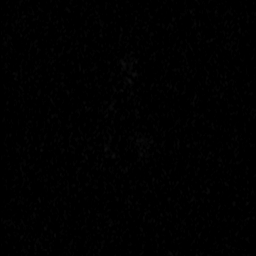

[Series 9: DWI · coronal · 5.0mm · 1.80mm/px · 5 of 36 slices shown (4 of 4)]
[im 1/36]
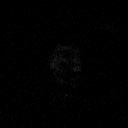
[im 9/36]
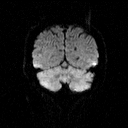
[im 18/36]
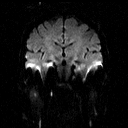
[im 27/36]
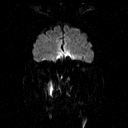
[im 36/36]
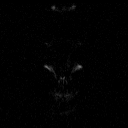

[Series 10: FLAIR · axial · 5.0mm · 0.90mm/px · z∈[-58,+104]mm · 3 of 26 slices shown]
[im 1/26]
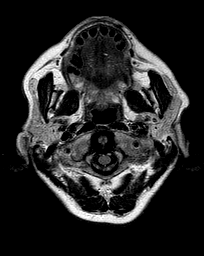
[im 13/26]
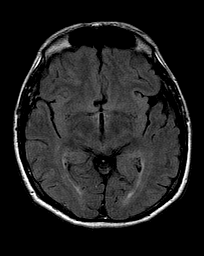
[im 26/26]
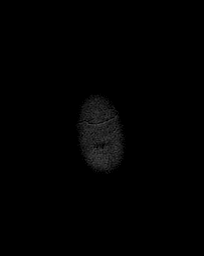

[Series 11: T2 · axial · 5.0mm · 0.45mm/px · z∈[-58,+104]mm · 3 of 26 slices shown (2 of 3)]
[im 1/26]
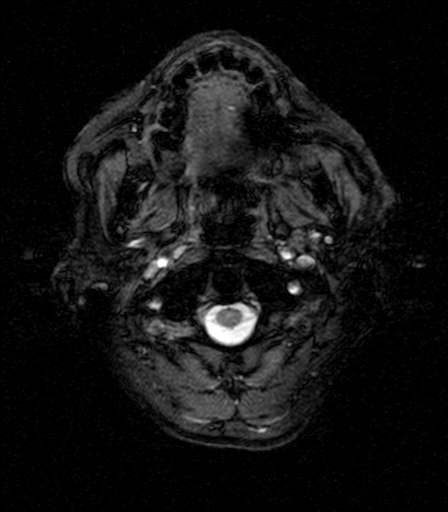
[im 13/26]
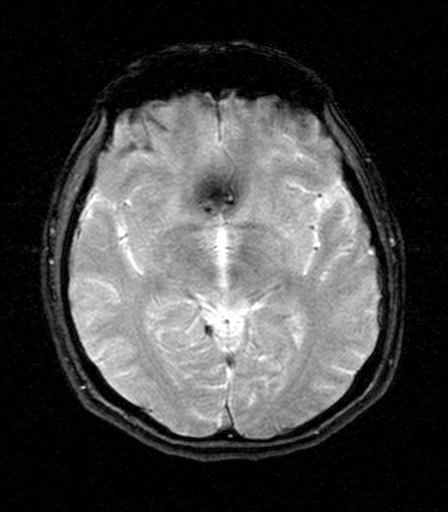
[im 26/26]
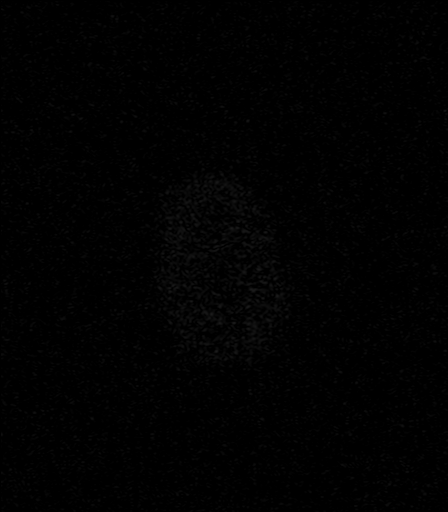

[Series 12: T1 · axial · 3.0mm · 0.45mm/px · z∈[-71,-50]mm · 2 of 64 slices shown (2 of 2)]
[im 1/64]
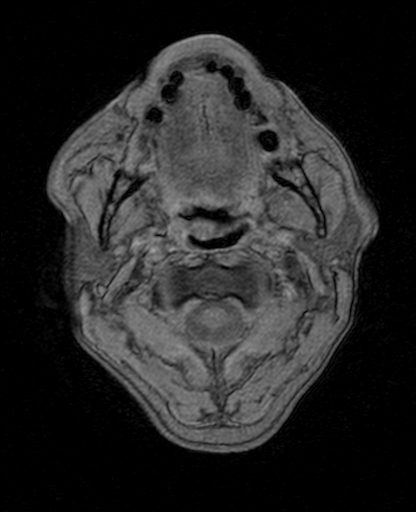
[im 8/64]
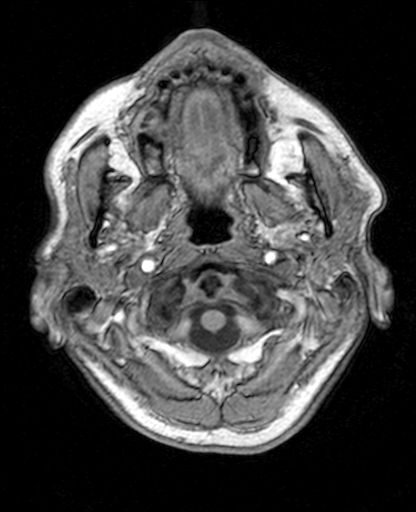

[Series 13: T2 · coronal · 5.0mm · 0.45mm/px · 4 of 28 slices shown (3 of 3)]
[im 1/28]
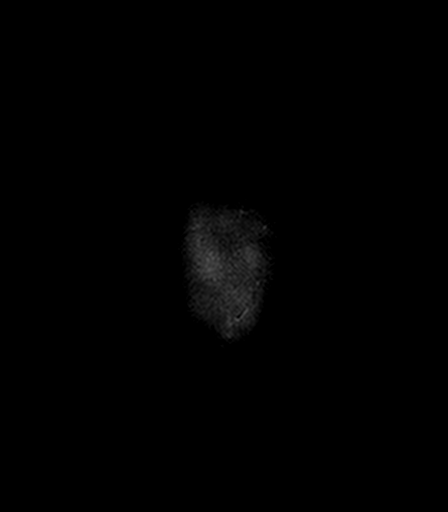
[im 10/28]
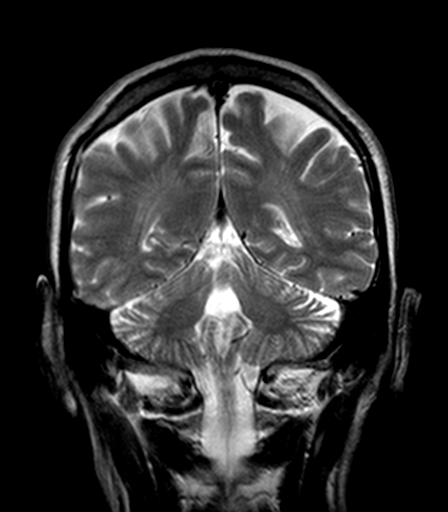
[im 19/28]
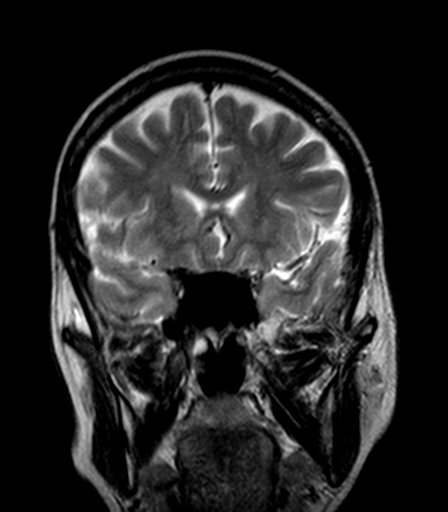
[im 28/28]
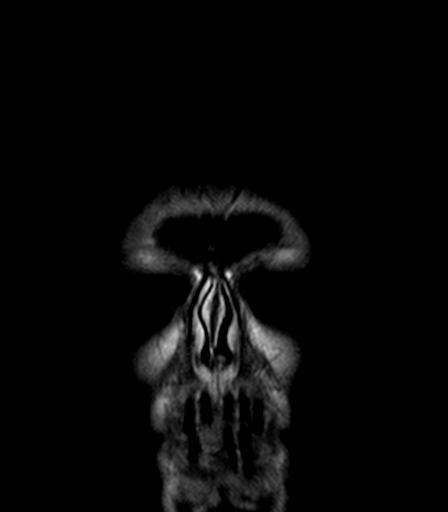

[41 of 48 positions shown; findings below may reference images not displayed]

FINDINGS: Calvarium and upper cervical spine: No focal marrow signal
abnormality.

Orbits: Negative.

Sinuses and Mastoids: Complete opacification of the right maxillary
sinus with peripheral mucosal thickening and central desiccated
mucous. The sinus is atelectatic consistent with longstanding
obstruction.

Brain: No acute infarct, hemorrhage, hydrocephalus, collection or
mass lesion. No evidence of large vessel occlusion. Mild
periventricular hazy FLAIR hyperintensity and few subcortical
hyperintensities. Findings are nonspecific but usually attributed to
chronic microvascular disease at this age. No specific demyelinating
pattern.
IMPRESSION: 1. No acute finding, including infarct.
2. Mild white matter disease, usually chronic microvascular
ischemia.
3. Chronic obstruction of the right maxillary sinus.
